# Patient Record
Sex: Female | Born: 1970 | Hispanic: No | Marital: Married | Smoking: Current every day smoker
Health system: Southern US, Community
[De-identification: ages and names within clinical notes are randomized; demographics above are authoritative.]

## PROBLEM LIST (undated history)

## (undated) DIAGNOSIS — M797 Fibromyalgia: Secondary | ICD-10-CM

## (undated) DIAGNOSIS — M329 Systemic lupus erythematosus, unspecified: Secondary | ICD-10-CM

## (undated) DIAGNOSIS — R569 Unspecified convulsions: Secondary | ICD-10-CM

---

## 2020-07-14 ENCOUNTER — Emergency Department (HOSPITAL_BASED_OUTPATIENT_CLINIC_OR_DEPARTMENT_OTHER)
Admission: EM | Admit: 2020-07-14 | Discharge: 2020-07-14 | Disposition: A | Discharge status: Home / Self Care | Payer: BC Managed Care – PPO | Attending: Emergency Medicine | Admitting: Emergency Medicine

## 2020-07-14 ENCOUNTER — Emergency Department (HOSPITAL_BASED_OUTPATIENT_CLINIC_OR_DEPARTMENT_OTHER): Payer: BC Managed Care – PPO

## 2020-07-14 ENCOUNTER — Other Ambulatory Visit: Payer: Self-pay

## 2020-07-14 ENCOUNTER — Encounter (HOSPITAL_BASED_OUTPATIENT_CLINIC_OR_DEPARTMENT_OTHER): Payer: Self-pay | Admitting: Emergency Medicine

## 2020-07-14 DIAGNOSIS — F17210 Nicotine dependence, cigarettes, uncomplicated: Secondary | ICD-10-CM | POA: Insufficient documentation

## 2020-07-14 DIAGNOSIS — R1084 Generalized abdominal pain: Secondary | ICD-10-CM | POA: Insufficient documentation

## 2020-07-14 DIAGNOSIS — R197 Diarrhea, unspecified: Secondary | ICD-10-CM | POA: Insufficient documentation

## 2020-07-14 DIAGNOSIS — R112 Nausea with vomiting, unspecified: Secondary | ICD-10-CM | POA: Insufficient documentation

## 2020-07-14 HISTORY — DX: Fibromyalgia: M79.7

## 2020-07-14 HISTORY — DX: Unspecified convulsions: R56.9

## 2020-07-14 HISTORY — DX: Systemic lupus erythematosus, unspecified: M32.9

## 2020-07-14 LAB — URINALYSIS, ROUTINE W REFLEX MICROSCOPIC
Bilirubin Urine: NEGATIVE
Glucose, UA: NEGATIVE mg/dL
Ketones, ur: NEGATIVE mg/dL
Leukocytes,Ua: NEGATIVE
Nitrite: NEGATIVE
Protein, ur: NEGATIVE mg/dL
Specific Gravity, Urine: >1.03 — ABNORMAL HIGH (ref 1.005–1.030)
pH: 6 (ref 5.0–8.0)

## 2020-07-14 LAB — COMPREHENSIVE METABOLIC PANEL
ALT: 19 U/L (ref 0–44)
AST: 18 U/L (ref 15–41)
Albumin: 4.7 g/dL (ref 3.5–5.0)
Alkaline Phosphatase: 80 U/L (ref 38–126)
Anion gap: 10 (ref 5–15)
BUN: 16 mg/dL (ref 6–20)
CO2: 25 mmol/L (ref 22–32)
Calcium: 9.5 mg/dL (ref 8.9–10.3)
Chloride: 105 mmol/L (ref 98–111)
Creatinine, Ser: 0.68 mg/dL (ref 0.44–1.00)
GFR, Estimated: >60 mL/min (ref >60)
Glucose, Bld: 121 mg/dL — ABNORMAL HIGH (ref 70–99)
Potassium: 3.2 mmol/L — ABNORMAL LOW (ref 3.5–5.1)
Sodium: 140 mmol/L (ref 135–145)
Total Bilirubin: 0.2 mg/dL — ABNORMAL LOW (ref 0.3–1.2)
Total Protein: 8.3 g/dL — ABNORMAL HIGH (ref 6.5–8.1)

## 2020-07-14 LAB — CBC WITH DIFFERENTIAL/PLATELET
Abs Immature Granulocytes: 0.12 10*3/uL — ABNORMAL HIGH (ref 0.00–0.07)
Basophils Absolute: 0 10*3/uL (ref 0.0–0.1)
Basophils Relative: 0 %
Eosinophils Absolute: 0 10*3/uL (ref 0.0–0.5)
Eosinophils Relative: 0 %
HCT: 48.8 % — ABNORMAL HIGH (ref 36.0–46.0)
Hemoglobin: 16.1 g/dL — ABNORMAL HIGH (ref 12.0–15.0)
Immature Granulocytes: 1 %
Lymphocytes Relative: 6 %
Lymphs Abs: 1.2 10*3/uL (ref 0.7–4.0)
MCH: 29.8 pg (ref 26.0–34.0)
MCHC: 33 g/dL (ref 30.0–36.0)
MCV: 90.4 fL (ref 80.0–100.0)
Monocytes Absolute: 2 10*3/uL — ABNORMAL HIGH (ref 0.1–1.0)
Monocytes Relative: 10 %
Neutro Abs: 16.7 10*3/uL — ABNORMAL HIGH (ref 1.7–7.7)
Neutrophils Relative %: 83 %
Platelets: 204 10*3/uL (ref 150–400)
RBC: 5.4 MIL/uL — ABNORMAL HIGH (ref 3.87–5.11)
RDW: 13.2 % (ref 11.5–15.5)
WBC: 20 10*3/uL — ABNORMAL HIGH (ref 4.0–10.5)
nRBC: 0 % (ref 0.0–0.2)

## 2020-07-14 LAB — LIPASE, BLOOD: Lipase: 27 U/L (ref 11–51)

## 2020-07-14 LAB — URINALYSIS, MICROSCOPIC (REFLEX)

## 2020-07-14 LAB — PREGNANCY, URINE: Preg Test, Ur: NEGATIVE

## 2020-07-14 MED ORDER — SODIUM CHLORIDE 0.9 % IV BOLUS
1000.0000 mL | Freq: Once | INTRAVENOUS | Status: AC
  Administered 2020-07-14: 1000 mL via INTRAVENOUS

## 2020-07-14 MED ORDER — OXYCODONE HCL ER 40 MG PO T12A: 40.0000 mg | EXTENDED_RELEASE_TABLET | Freq: Once | ORAL | Status: DC

## 2020-07-14 MED ORDER — MORPHINE SULFATE (PF) 4 MG/ML IV SOLN
4.0000 mg | Freq: Once | INTRAVENOUS | Status: DC
  Filled 2020-07-14: qty 1

## 2020-07-14 MED ORDER — OXYCODONE-ACETAMINOPHEN 5-325 MG PO TABS
2.0000 | ORAL_TABLET | Freq: Once | ORAL | Status: AC
  Administered 2020-07-14: 2 via ORAL
  Filled 2020-07-14: qty 2

## 2020-07-14 MED ORDER — OXYCODONE-ACETAMINOPHEN 5-325 MG PO TABS
2.0000 | ORAL_TABLET | Freq: Once | ORAL | Status: DC
Start: 2020-07-14 — End: 2020-07-15

## 2020-07-14 MED ORDER — OXYCODONE HCL ER 40 MG PO T12A: 40.0000 mg | EXTENDED_RELEASE_TABLET | Freq: Two times a day (BID) | ORAL | 0 refills | Status: DC

## 2020-07-14 MED ORDER — ONDANSETRON HCL 4 MG/2ML IJ SOLN
4.0000 mg | Freq: Once | INTRAMUSCULAR | Status: AC
  Administered 2020-07-14: 4 mg via INTRAVENOUS
  Filled 2020-07-14: qty 2

## 2020-07-14 MED ORDER — OXYCODONE HCL ER 40 MG PO T12A: 40.0000 mg | EXTENDED_RELEASE_TABLET | Freq: Two times a day (BID) | ORAL | 0 refills | Status: AC

## 2020-07-14 MED ORDER — DICYCLOMINE HCL 10 MG/ML IM SOLN
20.0000 mg | Freq: Once | INTRAMUSCULAR | Status: AC
  Administered 2020-07-14: 20 mg via INTRAMUSCULAR
  Filled 2020-07-14: qty 2

## 2020-07-14 MED ORDER — ONDANSETRON HCL 4 MG PO TABS: 4.0000 mg | ORAL_TABLET | Freq: Four times a day (QID) | ORAL | 0 refills | Status: AC

## 2020-07-14 MED ORDER — FENTANYL CITRATE (PF) 100 MCG/2ML IJ SOLN
50.0000 ug | Freq: Once | INTRAMUSCULAR | Status: AC
  Administered 2020-07-14: 50 ug via INTRAVENOUS
  Filled 2020-07-14: qty 2

## 2020-07-14 MED ORDER — HALOPERIDOL LACTATE 5 MG/ML IJ SOLN
2.0000 mg | Freq: Once | INTRAMUSCULAR | Status: AC
  Administered 2020-07-14: 2 mg via INTRAVENOUS
  Filled 2020-07-14: qty 1

## 2020-07-14 NOTE — ED Notes (Signed)
Pt sleeping at this time, easily rousable, VS rechecked, pt fell back to sleep. Morphine held at this time.  Dr Lockie Mola made aware.

## 2020-07-14 NOTE — ED Notes (Signed)
Attempt to insert PIV unsuccessful x2, nurse to try with u/s
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
## 2022-06-28 IMAGING — CT CT ABD-PELV W/O CM
2 of 4 series · 16 of 46 positions shown, 18 images · non-contrast
Comparison: None.

CLINICAL DATA: Nausea, vomiting and diarrhea.

EXAM:
CT ABDOMEN AND PELVIS WITHOUT CONTRAST
TECHNIQUE: Multidetector CT imaging of the abdomen and pelvis was performed
following the standard protocol without IV contrast.

[Series 2: axial st · axial · 0.85mm/px · z∈[-446,-16]mm · 13 of 94 slices shown, 15 images]
[im 4/94  soft-tissue]
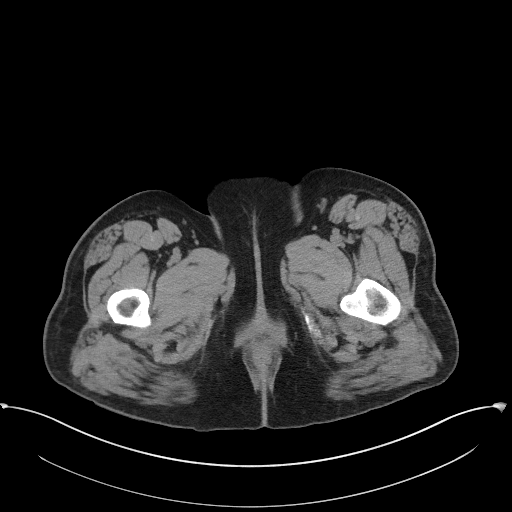
[im 4/94  bone]
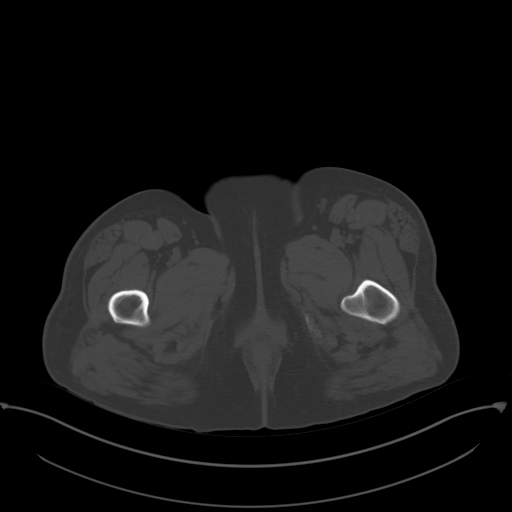
[im 12/94  soft-tissue]
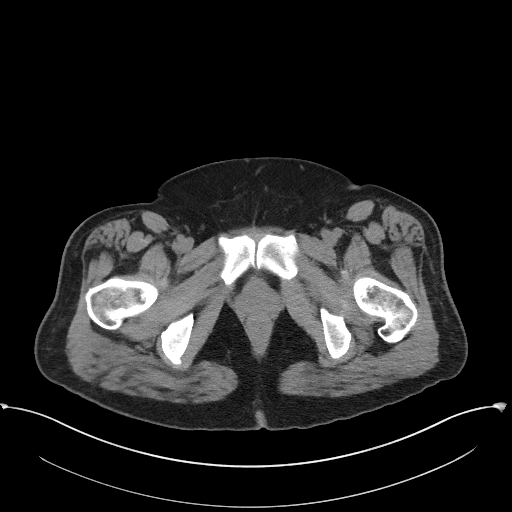
[im 20/94  soft-tissue]
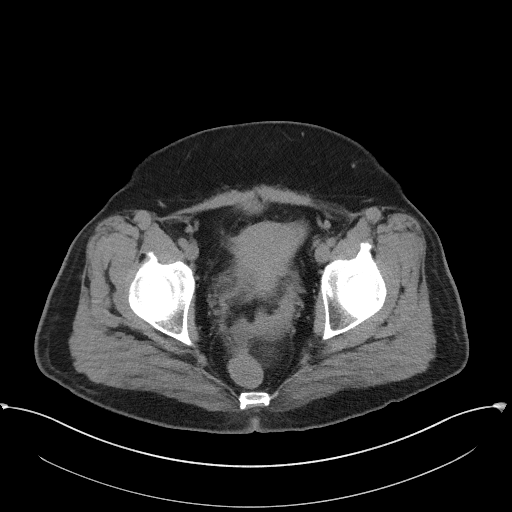
[im 28/94  soft-tissue]
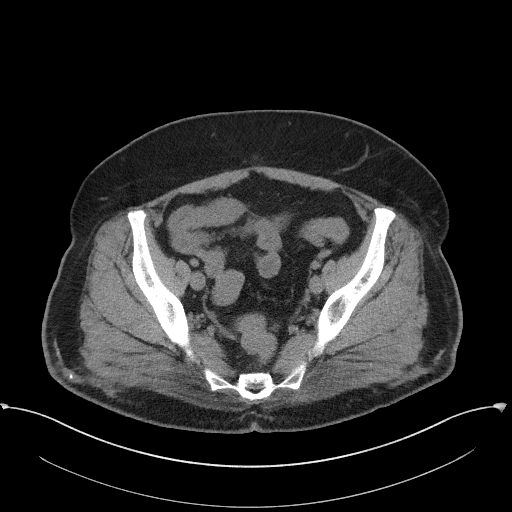
[im 32/94  soft-tissue]
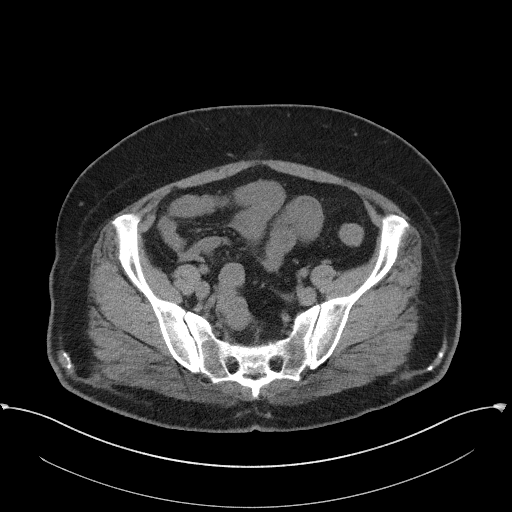
[im 39/94  soft-tissue]
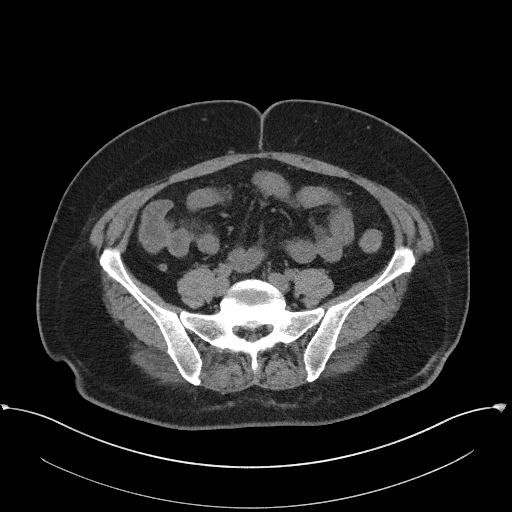
[im 47/94  soft-tissue]
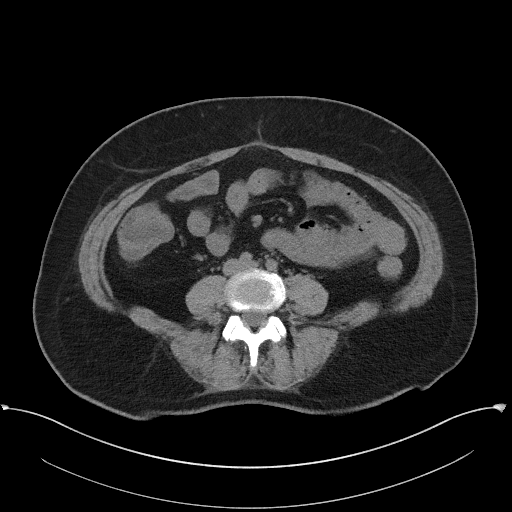
[im 55/94  soft-tissue]
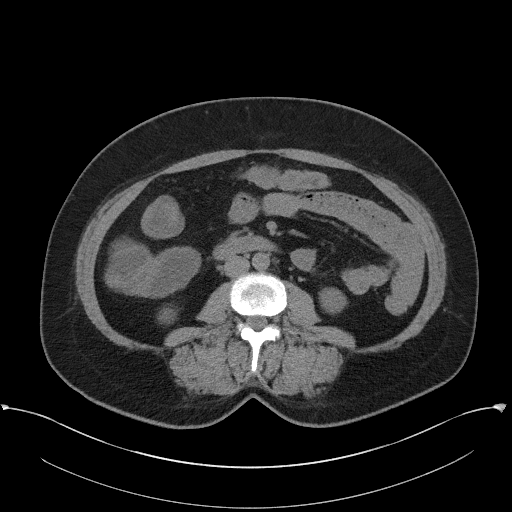
[im 63/94  soft-tissue]
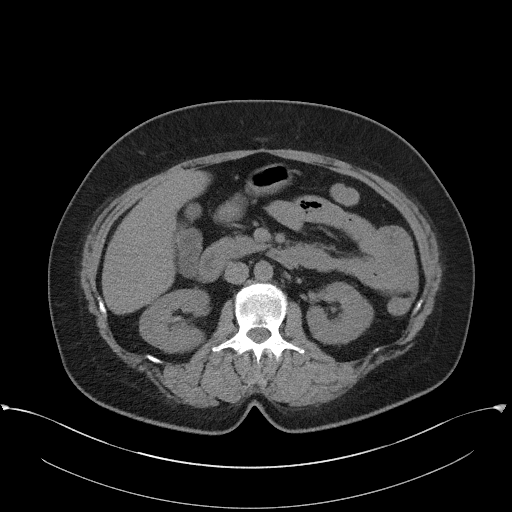
[im 63/94  bone]
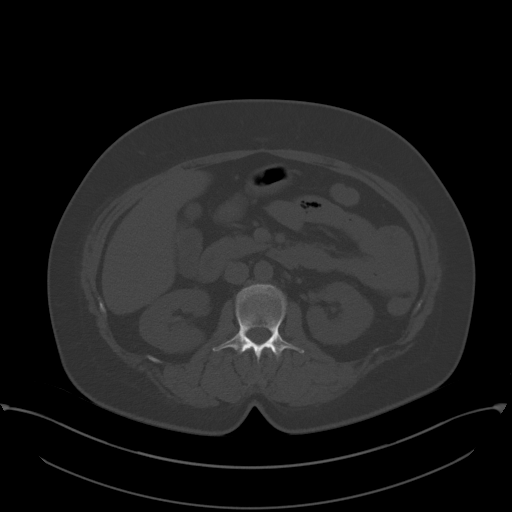
[im 66/94  soft-tissue]
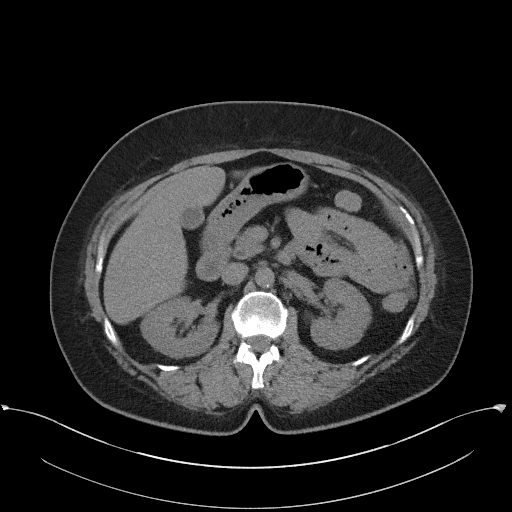
[im 74/94  soft-tissue]
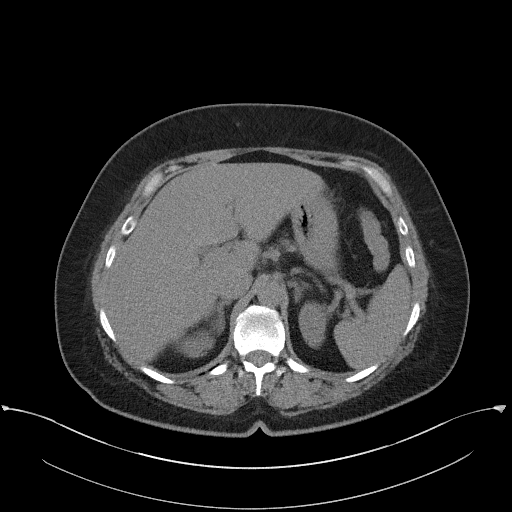
[im 82/94  soft-tissue]
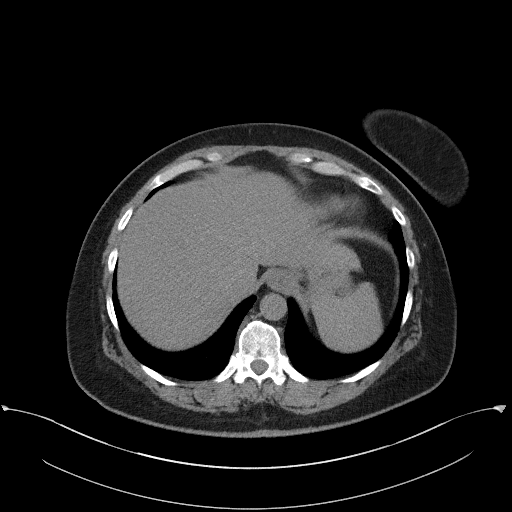
[im 90/94  soft-tissue]
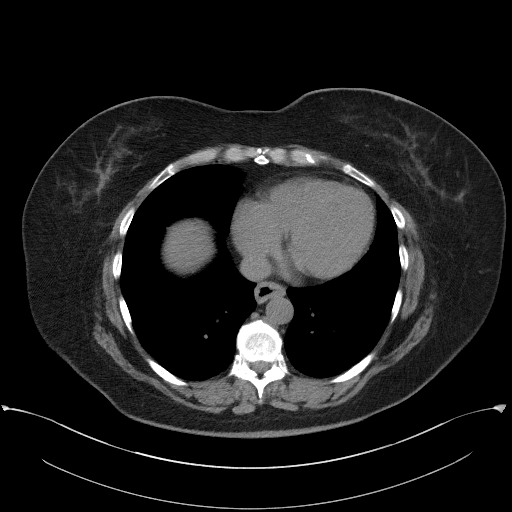

[Series 5: coronal st · coronal · 0.89mm/px · 3 of 102 slices shown]
[im 34/102  soft-tissue]
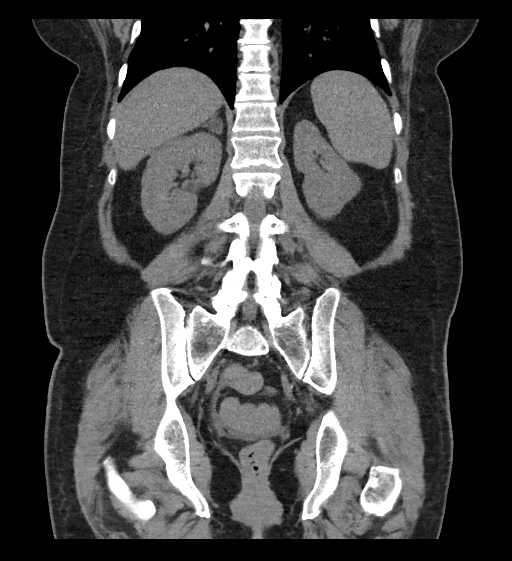
[im 45/102  soft-tissue]
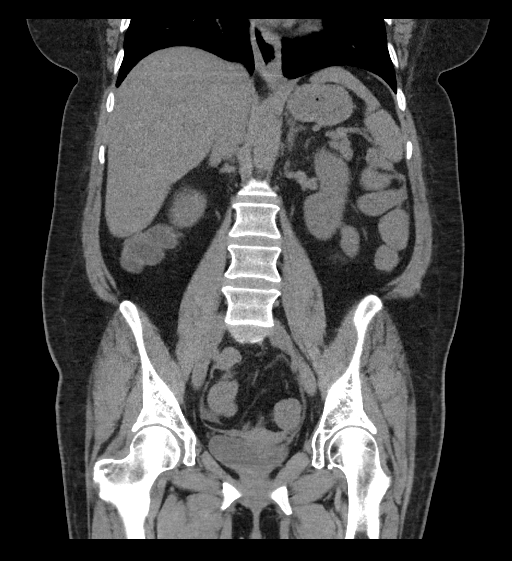
[im 57/102  soft-tissue]
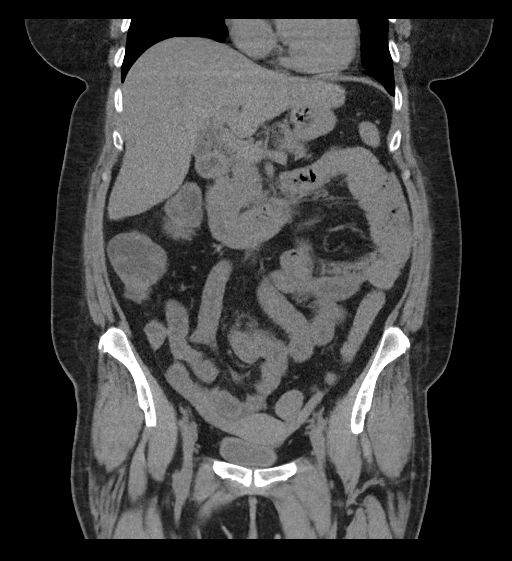

[16 of 46 positions shown; findings below may reference images not displayed]

FINDINGS: Lower chest: No acute abnormality.

Hepatobiliary: No focal liver abnormality is seen. No gallstones,
gallbladder wall thickening, or biliary dilatation.

Pancreas: Unremarkable. No pancreatic ductal dilatation or
surrounding inflammatory changes.

Spleen: Normal in size without focal abnormality.

Adrenals/Urinary Tract: A 1.6 cm x 1.3 cm fat attenuation
(approximately -16.56 Hounsfield units) right adrenal mass is seen.
The left adrenal gland is normal in appearance. Kidneys are normal,
without renal calculi, focal lesion, or hydronephrosis. Bladder is
unremarkable.

Stomach/Bowel: Stomach is within normal limits. Appendix appears
normal. No evidence of bowel dilatation. Mild peri intestinal
inflammatory fat stranding is seen involving multiple loops of
jejunum and ileum. Mildly thickened small bowel loops are also noted
within this region.

Vascular/Lymphatic: Aortic atherosclerosis. No enlarged abdominal or
pelvic lymph nodes.

Reproductive: Uterus and bilateral adnexa are unremarkable.

Other: A 3.0 cm x 2.2 cm fat containing paraumbilical hernia is
noted on the left. No abdominopelvic ascites.

Musculoskeletal: No acute or significant osseous findings.
IMPRESSION: 1. Findings consistent with mild enteritis.
2. Small right adrenal myelolipoma.
3. Fat containing paraumbilical hernia.
